# Patient Record
Sex: Female | Born: 1975 | Race: White | Hispanic: No | Marital: Married | Smoking: Former smoker
Health system: Southern US, Community
[De-identification: ages and names within clinical notes are randomized; demographics above are authoritative.]

## PROBLEM LIST (undated history)

## (undated) DIAGNOSIS — K589 Irritable bowel syndrome without diarrhea: Secondary | ICD-10-CM

## (undated) DIAGNOSIS — F329 Major depressive disorder, single episode, unspecified: Secondary | ICD-10-CM

## (undated) DIAGNOSIS — T8859XA Other complications of anesthesia, initial encounter: Secondary | ICD-10-CM

## (undated) DIAGNOSIS — T4145XA Adverse effect of unspecified anesthetic, initial encounter: Secondary | ICD-10-CM

## (undated) DIAGNOSIS — N189 Chronic kidney disease, unspecified: Secondary | ICD-10-CM

## (undated) DIAGNOSIS — F32A Depression, unspecified: Secondary | ICD-10-CM

## (undated) DIAGNOSIS — K219 Gastro-esophageal reflux disease without esophagitis: Secondary | ICD-10-CM

## (undated) HISTORY — PX: CHOLECYSTECTOMY: SHX55

---

## 1998-07-13 ENCOUNTER — Other Ambulatory Visit: Admission: RE | Admit: 1998-07-13 | Discharge: 1998-07-13 | Payer: Self-pay | Admitting: *Deleted

## 1998-11-05 ENCOUNTER — Inpatient Hospital Stay (HOSPITAL_COMMUNITY): Admission: AD | Admit: 1998-11-05 | Discharge: 1998-11-05 | Payer: Self-pay | Admitting: Obstetrics & Gynecology

## 1998-11-30 ENCOUNTER — Inpatient Hospital Stay (HOSPITAL_COMMUNITY): Admission: AD | Admit: 1998-11-30 | Discharge: 1998-12-03 | Payer: Self-pay | Admitting: *Deleted

## 1999-03-06 ENCOUNTER — Other Ambulatory Visit: Admission: RE | Admit: 1999-03-06 | Discharge: 1999-03-06 | Payer: Self-pay | Admitting: *Deleted

## 2001-04-07 ENCOUNTER — Other Ambulatory Visit: Admission: RE | Admit: 2001-04-07 | Discharge: 2001-04-07 | Payer: Self-pay | Admitting: *Deleted

## 2002-06-04 ENCOUNTER — Other Ambulatory Visit: Admission: RE | Admit: 2002-06-04 | Discharge: 2002-06-04 | Payer: Self-pay | Admitting: *Deleted

## 2004-04-25 ENCOUNTER — Other Ambulatory Visit: Admission: RE | Admit: 2004-04-25 | Discharge: 2004-04-25 | Payer: Self-pay | Admitting: Obstetrics and Gynecology

## 2005-03-21 ENCOUNTER — Other Ambulatory Visit: Admission: RE | Admit: 2005-03-21 | Discharge: 2005-03-21 | Payer: Self-pay | Admitting: Obstetrics and Gynecology

## 2005-05-20 ENCOUNTER — Inpatient Hospital Stay (HOSPITAL_COMMUNITY): Admission: AD | Admit: 2005-05-20 | Discharge: 2005-05-20 | Payer: Self-pay | Admitting: Obstetrics and Gynecology

## 2005-06-17 ENCOUNTER — Inpatient Hospital Stay (HOSPITAL_COMMUNITY): Admission: AD | Admit: 2005-06-17 | Discharge: 2005-06-20 | Payer: Self-pay | Admitting: Obstetrics and Gynecology

## 2005-09-13 ENCOUNTER — Inpatient Hospital Stay (HOSPITAL_COMMUNITY): Admission: AD | Admit: 2005-09-13 | Discharge: 2005-09-13 | Payer: Self-pay | Admitting: Obstetrics and Gynecology

## 2005-10-05 ENCOUNTER — Inpatient Hospital Stay (HOSPITAL_COMMUNITY): Admission: AD | Admit: 2005-10-05 | Discharge: 2005-10-08 | Payer: Self-pay | Admitting: Obstetrics and Gynecology

## 2005-10-09 ENCOUNTER — Encounter: Admission: RE | Admit: 2005-10-09 | Discharge: 2005-11-08 | Payer: Self-pay | Admitting: Obstetrics and Gynecology

## 2008-02-27 ENCOUNTER — Ambulatory Visit: Payer: Self-pay | Admitting: Urology

## 2008-03-03 ENCOUNTER — Ambulatory Visit: Payer: Self-pay | Admitting: Urology

## 2010-10-02 ENCOUNTER — Other Ambulatory Visit: Payer: Self-pay

## 2010-11-19 ENCOUNTER — Ambulatory Visit: Payer: Self-pay | Admitting: Family Medicine

## 2011-01-22 LAB — GC/CHLAMYDIA PROBE AMP, GENITAL: Chlamydia: NEGATIVE

## 2011-01-22 LAB — ANTIBODY SCREEN: Antibody Screen: NEGATIVE

## 2011-01-22 LAB — ABO/RH: RH Type: POSITIVE

## 2011-01-22 LAB — RUBELLA ANTIBODY, IGM: Rubella: IMMUNE

## 2011-02-13 ENCOUNTER — Other Ambulatory Visit: Payer: Self-pay

## 2011-02-23 ENCOUNTER — Other Ambulatory Visit (HOSPITAL_COMMUNITY): Payer: Self-pay | Admitting: Obstetrics and Gynecology

## 2011-02-23 DIAGNOSIS — Z3689 Encounter for other specified antenatal screening: Secondary | ICD-10-CM

## 2011-02-23 DIAGNOSIS — Z20828 Contact with and (suspected) exposure to other viral communicable diseases: Secondary | ICD-10-CM

## 2011-03-20 ENCOUNTER — Ambulatory Visit (HOSPITAL_COMMUNITY)
Admission: RE | Admit: 2011-03-20 | Discharge: 2011-03-20 | Disposition: A | Discharge status: Home / Self Care | Payer: 59 | Source: Ambulatory Visit | Attending: Obstetrics and Gynecology | Admitting: Obstetrics and Gynecology

## 2011-03-20 ENCOUNTER — Encounter (HOSPITAL_COMMUNITY): Payer: Self-pay

## 2011-03-20 DIAGNOSIS — O09529 Supervision of elderly multigravida, unspecified trimester: Secondary | ICD-10-CM | POA: Insufficient documentation

## 2011-03-20 DIAGNOSIS — Z3689 Encounter for other specified antenatal screening: Secondary | ICD-10-CM

## 2011-03-20 DIAGNOSIS — O34219 Maternal care for unspecified type scar from previous cesarean delivery: Secondary | ICD-10-CM | POA: Insufficient documentation

## 2011-03-20 DIAGNOSIS — O358XX0 Maternal care for other (suspected) fetal abnormality and damage, not applicable or unspecified: Secondary | ICD-10-CM | POA: Insufficient documentation

## 2011-03-20 DIAGNOSIS — Z1389 Encounter for screening for other disorder: Secondary | ICD-10-CM | POA: Insufficient documentation

## 2011-03-20 DIAGNOSIS — Z363 Encounter for antenatal screening for malformations: Secondary | ICD-10-CM | POA: Insufficient documentation

## 2011-03-20 DIAGNOSIS — Z20828 Contact with and (suspected) exposure to other viral communicable diseases: Secondary | ICD-10-CM

## 2011-03-20 NOTE — Progress Notes (Signed)
MATERNAL FETAL MEDICINE CONSULT  Patient Name: Shawna Snow Medical Record Number:  [id number] Date of Birth: 12/29/1975 Requesting Physician Name:  Roselle Locus II Date of Service: 03/20/2011  Chief Complaint Parvovirus exposure  History of Present Illness Shawna Snow was seen today for prenatal diagnosis secondary to parvovirus ex[posure at the request of Dr. Henderson Cloud.  The patient is a 35 y.o. [id number], with an EDD of 08/26/2011, by Last Menstrual Period dating method.  Her two sons were sequentially exposed shortly after her LMP.  She had myalgia/arthralgia symptoms during the same time. She had a positive parvovirus IgM on 8/16, at would have been about 5-[redacted] weeks gestation. She is now about 11-13 weeks from exposure.    Review of Systems A comprehensive review of systems was negative.  Patient History OB History    Grav Para Term Preterm Abortions TAB SAB Ect Mult Living   3 2 2  0 0 0 0 0 0 2     # Outc Date GA Lbr Len/2nd Wgt Sex Del Anes PTL Lv   1 TRM            2 TRM            3 CUR               No past medical history on file.  No past surgical history on file.  History   Social History  . Marital Status: Married    Spouse Name: N/A    Number of Children: N/A  . Years of Education: N/A   Social History Main Topics  . Smoking status: Not on file  . Smokeless tobacco: Not on file  . Alcohol Use: Not on file  . Drug Use: Not on file  . Sexually Active: Not on file   Other Topics Concern  . Not on file   Social History Narrative  . No narrative on file    No family history on file. In addition, the patient has no family history of mental retardation, birth defects, or genetic diseases.  Physical Examination Filed Vitals:   03/20/11 1405  BP: 113/66  Pulse: 83   A fetal ultrasound study today showed no evidence for fetal congestive heart failure at [redacted] weeks gestation.  The fetal anatomic survey was remarkable for a suggestion of extraaxial  polydactyly.   Assessment and Recommendations 1. Parvovirus exposure Follow up study in one week with middle cerebral artery Doppler studies over next 3 weeks to assess for fetal anemia and/or congestive heart failure. It may be that the patient is past critical fetal exposure time with an 80% probaility that the fetus was not severely affected.  She was counseled that parvovirus is not associated with known fetal anatomic abnormalities.  2. Polydactyly Follow-up assessment of fetal hand and feet, as well further anatomic assessment for other potential abnormalities not seen as well at 17 weeks.  So far, there is no evidence of encephalocele, renal abnormalities, small chest, microcephaly, or craniosynostosis.  The possibility of isolated polydactyly was also mentioned.   I spent 25 minutes in face to face consultation with the patient. Thank you for the opportunity to work with Mrs [place].  Ameliyah Sarno,JOE

## 2011-03-22 ENCOUNTER — Other Ambulatory Visit (HOSPITAL_COMMUNITY): Payer: Self-pay | Admitting: Obstetrics and Gynecology

## 2011-03-22 DIAGNOSIS — Z20828 Contact with and (suspected) exposure to other viral communicable diseases: Secondary | ICD-10-CM

## 2011-03-27 ENCOUNTER — Ambulatory Visit (HOSPITAL_COMMUNITY)
Admission: RE | Admit: 2011-03-27 | Discharge: 2011-03-27 | Disposition: A | Discharge status: Home / Self Care | Payer: 59 | Source: Ambulatory Visit | Attending: Obstetrics and Gynecology | Admitting: Obstetrics and Gynecology

## 2011-03-27 DIAGNOSIS — Z20828 Contact with and (suspected) exposure to other viral communicable diseases: Secondary | ICD-10-CM

## 2011-03-27 DIAGNOSIS — O34219 Maternal care for unspecified type scar from previous cesarean delivery: Secondary | ICD-10-CM | POA: Insufficient documentation

## 2011-03-27 DIAGNOSIS — Z3689 Encounter for other specified antenatal screening: Secondary | ICD-10-CM

## 2011-03-27 DIAGNOSIS — O09529 Supervision of elderly multigravida, unspecified trimester: Secondary | ICD-10-CM | POA: Insufficient documentation

## 2011-03-27 NOTE — Progress Notes (Signed)
Obstetric ultrasound performed today.  Please see report in [facility]. 

## 2011-03-29 NOTE — Progress Notes (Signed)
Encounter addended by: Marlana Latus, RN on: 03/29/2011  5:16 PM<BR>     Documentation filed: Episodes, Chief Complaint Section

## 2011-04-04 ENCOUNTER — Ambulatory Visit (HOSPITAL_COMMUNITY): Payer: 59

## 2011-04-10 ENCOUNTER — Ambulatory Visit (HOSPITAL_COMMUNITY)
Admission: RE | Admit: 2011-04-10 | Discharge: 2011-04-10 | Disposition: A | Discharge status: Home / Self Care | Payer: 59 | Source: Ambulatory Visit | Attending: Maternal and Fetal Medicine | Admitting: Maternal and Fetal Medicine

## 2011-04-10 DIAGNOSIS — Z20828 Contact with and (suspected) exposure to other viral communicable diseases: Secondary | ICD-10-CM

## 2011-04-10 DIAGNOSIS — O09529 Supervision of elderly multigravida, unspecified trimester: Secondary | ICD-10-CM | POA: Insufficient documentation

## 2011-04-10 DIAGNOSIS — O34219 Maternal care for unspecified type scar from previous cesarean delivery: Secondary | ICD-10-CM | POA: Insufficient documentation

## 2011-04-10 NOTE — Progress Notes (Signed)
Shawna Snow was seen for ultrasound appointment today.  Please see [facility] report for details.

## 2011-04-19 ENCOUNTER — Ambulatory Visit (HOSPITAL_COMMUNITY)
Admission: RE | Admit: 2011-04-19 | Discharge: 2011-04-19 | Disposition: A | Discharge status: Home / Self Care | Payer: 59 | Source: Ambulatory Visit | Attending: Obstetrics and Gynecology | Admitting: Obstetrics and Gynecology

## 2011-04-19 DIAGNOSIS — O34219 Maternal care for unspecified type scar from previous cesarean delivery: Secondary | ICD-10-CM | POA: Insufficient documentation

## 2011-04-19 DIAGNOSIS — Z20828 Contact with and (suspected) exposure to other viral communicable diseases: Secondary | ICD-10-CM

## 2011-04-19 DIAGNOSIS — O09529 Supervision of elderly multigravida, unspecified trimester: Secondary | ICD-10-CM | POA: Insufficient documentation

## 2011-07-17 ENCOUNTER — Ambulatory Visit: Payer: Self-pay | Admitting: Internal Medicine

## 2011-08-06 ENCOUNTER — Encounter (HOSPITAL_COMMUNITY): Payer: Self-pay | Admitting: Pharmacist

## 2011-08-14 ENCOUNTER — Encounter (HOSPITAL_COMMUNITY): Payer: Self-pay

## 2011-08-15 ENCOUNTER — Encounter (HOSPITAL_COMMUNITY): Payer: Self-pay

## 2011-08-15 ENCOUNTER — Encounter (HOSPITAL_COMMUNITY)
Admission: RE | Admit: 2011-08-15 | Discharge: 2011-08-15 | Disposition: A | Discharge status: Home / Self Care | Payer: 59 | Source: Ambulatory Visit | Attending: Obstetrics and Gynecology | Admitting: Obstetrics and Gynecology

## 2011-08-15 HISTORY — DX: Chronic kidney disease, unspecified: N18.9

## 2011-08-15 HISTORY — DX: Major depressive disorder, single episode, unspecified: F32.9

## 2011-08-15 HISTORY — DX: Adverse effect of unspecified anesthetic, initial encounter: T41.45XA

## 2011-08-15 HISTORY — DX: Gastro-esophageal reflux disease without esophagitis: K21.9

## 2011-08-15 HISTORY — DX: Irritable bowel syndrome, unspecified: K58.9

## 2011-08-15 HISTORY — DX: Depression, unspecified: F32.A

## 2011-08-15 HISTORY — DX: Other complications of anesthesia, initial encounter: T88.59XA

## 2011-08-15 LAB — CBC
MCH: 30.1 pg (ref 26.0–34.0)
MCHC: 34.1 g/dL (ref 30.0–36.0)
RBC: 3.49 MIL/uL — ABNORMAL LOW (ref 3.87–5.11)
RDW: 14 % (ref 11.5–15.5)
WBC: 10.9 10*3/uL — ABNORMAL HIGH (ref 4.0–10.5)

## 2011-08-15 LAB — SURGICAL PCR SCREEN: Staphylococcus aureus: NEGATIVE

## 2011-08-15 LAB — RPR: RPR Ser Ql: NONREACTIVE

## 2011-08-15 NOTE — Patient Instructions (Signed)
YOUR PROCEDURE IS SCHEDULED ON:08/20/11  ENTER THROUGH THE MAIN ENTRANCE OF [facility] AT:1130 am  USE DESK PHONE AND DIAL [phone] TO INFORM us OF YOUR ARRIVAL  CALL [phone] IF YOU HAVE ANY QUESTIONS OR PROBLEMS PRIOR TO YOUR ARRIVAL.  REMEMBER: DO NOT EAT AFTER MIDNIGHT : Sunday  SPECIAL INSTRUCTIONS:clear liquids ok until 9am on Monday   YOU MAY BRUSH YOUR TEETH THE MORNING OF SURGERY   TAKE THESE MEDICINES THE DAY OF SURGERY WITH SIP OF WATER:none   DO NOT WEAR JEWELRY, EYE MAKEUP, LIPSTICK OR DARK FINGERNAIL POLISH DO NOT WEAR LOTIONS  DO NOT SHAVE FOR 48 HOURS PRIOR TO SURGERY  YOU WILL NOT BE ALLOWED TO DRIVE YOURSELF HOME.  NAME OF DRIVER:Jason

## 2011-08-19 ENCOUNTER — Other Ambulatory Visit: Payer: Self-pay | Admitting: Obstetrics and Gynecology

## 2011-08-19 MED ORDER — DEXTROSE 5 % IV SOLN
INTRAVENOUS | Status: AC
  Administered 2011-08-20: 100 mL via INTRAVENOUS
  Filled 2011-08-19: qty 2.5

## 2011-08-20 ENCOUNTER — Encounter (HOSPITAL_COMMUNITY): Payer: Self-pay | Admitting: Anesthesiology

## 2011-08-20 ENCOUNTER — Inpatient Hospital Stay (HOSPITAL_COMMUNITY): Payer: 59

## 2011-08-20 ENCOUNTER — Encounter (HOSPITAL_COMMUNITY)
Admission: RE | Disposition: A | Discharge status: Home / Self Care | Payer: Self-pay | Source: Ambulatory Visit | Attending: Obstetrics and Gynecology

## 2011-08-20 ENCOUNTER — Encounter (HOSPITAL_COMMUNITY): Payer: Self-pay

## 2011-08-20 ENCOUNTER — Encounter (HOSPITAL_COMMUNITY): Payer: Self-pay | Admitting: *Deleted

## 2011-08-20 ENCOUNTER — Inpatient Hospital Stay (HOSPITAL_COMMUNITY)
Admission: RE | Admit: 2011-08-20 | Discharge: 2011-08-23 | DRG: 766 | Disposition: A | Discharge status: Home / Self Care | Payer: 59 | Source: Ambulatory Visit | Attending: Obstetrics and Gynecology | Admitting: Obstetrics and Gynecology

## 2011-08-20 DIAGNOSIS — Z01818 Encounter for other preprocedural examination: Secondary | ICD-10-CM

## 2011-08-20 DIAGNOSIS — O09529 Supervision of elderly multigravida, unspecified trimester: Secondary | ICD-10-CM | POA: Diagnosis present

## 2011-08-20 DIAGNOSIS — Z302 Encounter for sterilization: Secondary | ICD-10-CM

## 2011-08-20 DIAGNOSIS — O34219 Maternal care for unspecified type scar from previous cesarean delivery: Principal | ICD-10-CM | POA: Diagnosis present

## 2011-08-20 DIAGNOSIS — Z01812 Encounter for preprocedural laboratory examination: Secondary | ICD-10-CM

## 2011-08-20 LAB — TYPE AND SCREEN
ABO/RH(D): O POS
Antibody Screen: NEGATIVE

## 2011-08-20 LAB — ABO/RH: ABO/RH(D): O POS

## 2011-08-20 SURGERY — Surgical Case
Anesthesia: Spinal | Site: Abdomen | Wound class: Clean Contaminated

## 2011-08-20 MED ORDER — SCOPOLAMINE 1 MG/3DAYS TD PT72
MEDICATED_PATCH | TRANSDERMAL | Status: AC
  Administered 2011-08-20: 1.5 mg
  Filled 2011-08-20: qty 1

## 2011-08-20 MED ORDER — HYDROMORPHONE HCL PF 1 MG/ML IJ SOLN
0.5000 mg | INTRAMUSCULAR | Status: DC | PRN
  Administered 2011-08-20: 0.5 mg via INTRAVENOUS
  Filled 2011-08-20: qty 1

## 2011-08-20 MED ORDER — MIDAZOLAM HCL 5 MG/ML IJ SOLN
INTRAMUSCULAR | Status: DC | PRN
  Administered 2011-08-20: 0.5 mg via INTRAVENOUS

## 2011-08-20 MED ORDER — SODIUM CHLORIDE 0.9 % IV SOLN: 1.0000 ug/kg/h | INTRAVENOUS | Status: DC | PRN

## 2011-08-20 MED ORDER — HYDROMORPHONE HCL PF 1 MG/ML IJ SOLN
INTRAMUSCULAR | Status: AC
  Filled 2011-08-20: qty 1

## 2011-08-20 MED ORDER — FENTANYL CITRATE 0.05 MG/ML IJ SOLN
INTRAMUSCULAR | Status: AC
  Filled 2011-08-20: qty 2

## 2011-08-20 MED ORDER — WITCH HAZEL-GLYCERIN EX PADS: 1.0000 "application " | MEDICATED_PAD | CUTANEOUS | Status: DC | PRN

## 2011-08-20 MED ORDER — DIPHENHYDRAMINE HCL 50 MG/ML IJ SOLN: 12.5000 mg | INTRAMUSCULAR | Status: DC | PRN

## 2011-08-20 MED ORDER — LACTATED RINGERS IV SOLN
INTRAVENOUS | Status: DC
  Administered 2011-08-20: 23:00:00 via INTRAVENOUS

## 2011-08-20 MED ORDER — OXYCODONE-ACETAMINOPHEN 5-325 MG PO TABS: 1.0000 | ORAL_TABLET | ORAL | Status: DC | PRN

## 2011-08-20 MED ORDER — MENTHOL 3 MG MT LOZG: 1.0000 | LOZENGE | OROMUCOSAL | Status: DC | PRN

## 2011-08-20 MED ORDER — ONDANSETRON HCL 4 MG/2ML IJ SOLN: 4.0000 mg | Freq: Three times a day (TID) | INTRAMUSCULAR | Status: DC | PRN

## 2011-08-20 MED ORDER — DIPHENHYDRAMINE HCL 50 MG/ML IJ SOLN: 25.0000 mg | INTRAMUSCULAR | Status: DC | PRN

## 2011-08-20 MED ORDER — LANOLIN HYDROUS EX OINT: 1.0000 "application " | TOPICAL_OINTMENT | CUTANEOUS | Status: DC | PRN

## 2011-08-20 MED ORDER — OXYTOCIN 10 UNIT/ML IJ SOLN
INTRAMUSCULAR | Status: AC
  Filled 2011-08-20: qty 4

## 2011-08-20 MED ORDER — ONDANSETRON HCL 4 MG/2ML IJ SOLN: 4.0000 mg | Freq: Once | INTRAMUSCULAR | Status: DC

## 2011-08-20 MED ORDER — BUPIVACAINE IN DEXTROSE 0.75-8.25 % IT SOLN
INTRATHECAL | Status: DC | PRN
  Administered 2011-08-20: 9.75 mg via INTRATHECAL

## 2011-08-20 MED ORDER — OXYTOCIN 20 UNITS IN LACTATED RINGERS INFUSION - SIMPLE
INTRAVENOUS | Status: DC | PRN
  Administered 2011-08-20: 20 [IU] via INTRAVENOUS

## 2011-08-20 MED ORDER — PHENYLEPHRINE 40 MCG/ML (10ML) SYRINGE FOR IV PUSH (FOR BLOOD PRESSURE SUPPORT)
PREFILLED_SYRINGE | INTRAVENOUS | Status: AC
  Filled 2011-08-20: qty 5

## 2011-08-20 MED ORDER — EPHEDRINE 5 MG/ML INJ
INTRAVENOUS | Status: AC
  Filled 2011-08-20: qty 10

## 2011-08-20 MED ORDER — SODIUM CHLORIDE 0.9 % IJ SOLN: 3.0000 mL | INTRAMUSCULAR | Status: DC | PRN

## 2011-08-20 MED ORDER — KETOROLAC TROMETHAMINE 60 MG/2ML IM SOLN
60.0000 mg | Freq: Once | INTRAMUSCULAR | Status: AC | PRN
  Administered 2011-08-20: 60 mg via INTRAMUSCULAR

## 2011-08-20 MED ORDER — SIMETHICONE 80 MG PO CHEW
80.0000 mg | CHEWABLE_TABLET | Freq: Three times a day (TID) | ORAL | Status: DC
  Administered 2011-08-20 – 2011-08-22 (×9): 80 mg via ORAL

## 2011-08-20 MED ORDER — KETOROLAC TROMETHAMINE 30 MG/ML IJ SOLN: 30.0000 mg | Freq: Four times a day (QID) | INTRAMUSCULAR | Status: AC | PRN

## 2011-08-20 MED ORDER — CITRIC ACID-SODIUM CITRATE 334-500 MG/5ML PO SOLN
30.0000 mL | Freq: Once | ORAL | Status: AC
  Administered 2011-08-20: 30 mL via ORAL

## 2011-08-20 MED ORDER — MEPERIDINE HCL 25 MG/ML IJ SOLN
INTRAMUSCULAR | Status: DC | PRN
  Administered 2011-08-20 (×2): 12.5 mg via INTRAVENOUS

## 2011-08-20 MED ORDER — ONDANSETRON HCL 4 MG/2ML IJ SOLN: 4.0000 mg | INTRAMUSCULAR | Status: DC | PRN

## 2011-08-20 MED ORDER — OXYTOCIN 20 UNITS IN LACTATED RINGERS INFUSION - SIMPLE
125.0000 mL/h | INTRAVENOUS | Status: AC
  Administered 2011-08-20: 125 mL/h via INTRAVENOUS

## 2011-08-20 MED ORDER — IBUPROFEN 600 MG PO TABS: 600.0000 mg | ORAL_TABLET | Freq: Four times a day (QID) | ORAL | Status: DC | PRN

## 2011-08-20 MED ORDER — NALBUPHINE HCL 10 MG/ML IJ SOLN
5.0000 mg | INTRAMUSCULAR | Status: DC | PRN
Start: 2011-08-20 — End: 2011-08-23

## 2011-08-20 MED ORDER — DIPHENHYDRAMINE HCL 25 MG PO CAPS: 25.0000 mg | ORAL_CAPSULE | Freq: Four times a day (QID) | ORAL | Status: DC | PRN

## 2011-08-20 MED ORDER — FENTANYL CITRATE 0.05 MG/ML IJ SOLN
25.0000 ug | INTRAMUSCULAR | Status: DC | PRN
  Administered 2011-08-20: 50 ug via INTRAVENOUS

## 2011-08-20 MED ORDER — MORPHINE SULFATE 0.5 MG/ML IJ SOLN
INTRAMUSCULAR | Status: AC
  Filled 2011-08-20: qty 10

## 2011-08-20 MED ORDER — IBUPROFEN 600 MG PO TABS
600.0000 mg | ORAL_TABLET | Freq: Four times a day (QID) | ORAL | Status: DC
  Administered 2011-08-20 – 2011-08-23 (×11): 600 mg via ORAL
  Filled 2011-08-20 (×11): qty 1

## 2011-08-20 MED ORDER — SENNOSIDES-DOCUSATE SODIUM 8.6-50 MG PO TABS
2.0000 | ORAL_TABLET | Freq: Every day | ORAL | Status: DC
  Administered 2011-08-20 – 2011-08-21 (×2): 2 via ORAL

## 2011-08-20 MED ORDER — OXYTOCIN 20 UNITS IN LACTATED RINGERS INFUSION - SIMPLE
INTRAVENOUS | Status: AC
  Administered 2011-08-20: 125 mL/h via INTRAVENOUS
  Filled 2011-08-20: qty 1000

## 2011-08-20 MED ORDER — MORPHINE SULFATE (PF) 0.5 MG/ML IJ SOLN
INTRAMUSCULAR | Status: DC | PRN
  Administered 2011-08-20: .1 mg via INTRATHECAL

## 2011-08-20 MED ORDER — LACTATED RINGERS IV SOLN
INTRAVENOUS | Status: DC
  Administered 2011-08-20 (×4): via INTRAVENOUS

## 2011-08-20 MED ORDER — SIMETHICONE 80 MG PO CHEW: 80.0000 mg | CHEWABLE_TABLET | ORAL | Status: DC | PRN

## 2011-08-20 MED ORDER — MEPERIDINE HCL 25 MG/ML IJ SOLN
INTRAMUSCULAR | Status: AC
  Filled 2011-08-20: qty 1

## 2011-08-20 MED ORDER — METOCLOPRAMIDE HCL 5 MG/ML IJ SOLN: 10.0000 mg | Freq: Three times a day (TID) | INTRAMUSCULAR | Status: DC | PRN

## 2011-08-20 MED ORDER — FENTANYL CITRATE 0.05 MG/ML IJ SOLN
INTRAMUSCULAR | Status: DC | PRN
  Administered 2011-08-20: 15 ug via INTRATHECAL
  Administered 2011-08-20: 50 ug via INTRAVENOUS
  Administered 2011-08-20: 85 ug via INTRAVENOUS
  Administered 2011-08-20: 50 ug via INTRAVENOUS

## 2011-08-20 MED ORDER — ZOLPIDEM TARTRATE 5 MG PO TABS: 5.0000 mg | ORAL_TABLET | Freq: Every evening | ORAL | Status: DC | PRN

## 2011-08-20 MED ORDER — DIPHENHYDRAMINE HCL 25 MG PO CAPS: 25.0000 mg | ORAL_CAPSULE | ORAL | Status: DC | PRN

## 2011-08-20 MED ORDER — ONDANSETRON HCL 4 MG PO TABS: 4.0000 mg | ORAL_TABLET | ORAL | Status: DC | PRN

## 2011-08-20 MED ORDER — CITRIC ACID-SODIUM CITRATE 334-500 MG/5ML PO SOLN
ORAL | Status: AC
  Administered 2011-08-20: 30 mL via ORAL
  Filled 2011-08-20: qty 15

## 2011-08-20 MED ORDER — NALOXONE HCL 0.4 MG/ML IJ SOLN: 0.4000 mg | INTRAMUSCULAR | Status: DC | PRN

## 2011-08-20 MED ORDER — KETOROLAC TROMETHAMINE 60 MG/2ML IM SOLN
INTRAMUSCULAR | Status: AC
  Administered 2011-08-20: 60 mg via INTRAMUSCULAR
  Filled 2011-08-20: qty 2

## 2011-08-20 MED ORDER — TETANUS-DIPHTH-ACELL PERTUSSIS 5-2.5-18.5 LF-MCG/0.5 IM SUSP
0.5000 mL | Freq: Once | INTRAMUSCULAR | Status: AC
  Administered 2011-08-22: 0.5 mL via INTRAMUSCULAR
  Filled 2011-08-20: qty 0.5

## 2011-08-20 MED ORDER — DIBUCAINE 1 % RE OINT: 1.0000 "application " | TOPICAL_OINTMENT | RECTAL | Status: DC | PRN

## 2011-08-20 MED ORDER — LIDOCAINE HCL 1 % IJ SOLN
INTRAMUSCULAR | Status: DC | PRN
  Administered 2011-08-20: 20 mL

## 2011-08-20 MED ORDER — ONDANSETRON HCL 4 MG/2ML IJ SOLN
INTRAMUSCULAR | Status: AC
  Filled 2011-08-20: qty 2

## 2011-08-20 MED ORDER — SCOPOLAMINE 1 MG/3DAYS TD PT72: 1.0000 | MEDICATED_PATCH | Freq: Once | TRANSDERMAL | Status: DC

## 2011-08-20 MED ORDER — MIDAZOLAM HCL 2 MG/2ML IJ SOLN
INTRAMUSCULAR | Status: AC
  Filled 2011-08-20: qty 2

## 2011-08-20 MED ORDER — ONDANSETRON HCL 4 MG/2ML IJ SOLN
INTRAMUSCULAR | Status: DC | PRN
  Administered 2011-08-20: 4 mg via INTRAVENOUS

## 2011-08-20 MED ORDER — PRENATAL MULTIVITAMIN CH
1.0000 | ORAL_TABLET | Freq: Every day | ORAL | Status: DC
  Administered 2011-08-21 – 2011-08-23 (×3): 1 via ORAL
  Filled 2011-08-20 (×3): qty 1

## 2011-08-20 MED ORDER — MEPERIDINE HCL 25 MG/ML IJ SOLN: 6.2500 mg | INTRAMUSCULAR | Status: DC | PRN

## 2011-08-20 SURGICAL SUPPLY — 26 items
CLOTH BEACON ORANGE TIMEOUT ST (SAFETY) ×2 IMPLANT
CONTAINER PREFILL 10% NBF 15ML (MISCELLANEOUS) ×2 IMPLANT
DRESSING TELFA 8X3 (GAUZE/BANDAGES/DRESSINGS) ×2 IMPLANT
ELECT REM PT RETURN 9FT ADLT (ELECTROSURGICAL) ×2
ELECTRODE REM PT RTRN 9FT ADLT (ELECTROSURGICAL) ×1 IMPLANT
EXTRACTOR VACUUM M CUP 4 TUBE (SUCTIONS) ×1 IMPLANT
GAUZE SPONGE 4X4 12PLY STRL LF (GAUZE/BANDAGES/DRESSINGS) ×3 IMPLANT
GLOVE BIO SURGEON STRL SZ8 (GLOVE) ×4 IMPLANT
GOWN PREVENTION PLUS LG XLONG (DISPOSABLE) ×2 IMPLANT
KIT ABG SYR 3ML LUER SLIP (SYRINGE) ×2 IMPLANT
NDL HYPO 25X5/8 SAFETYGLIDE (NEEDLE) ×1 IMPLANT
NEEDLE HYPO 25X5/8 SAFETYGLIDE (NEEDLE) ×2 IMPLANT
NS IRRIG 1000ML POUR BTL (IV SOLUTION) ×2 IMPLANT
PACK C SECTION WH (CUSTOM PROCEDURE TRAY) ×2 IMPLANT
PAD ABD 7.5X8 STRL (GAUZE/BANDAGES/DRESSINGS) ×2 IMPLANT
SLEEVE SCD COMPRESS KNEE MED (MISCELLANEOUS) IMPLANT
STAPLER VISISTAT 35W (STAPLE) ×1 IMPLANT
STRIP CLOSURE SKIN 1/2X4 (GAUZE/BANDAGES/DRESSINGS) ×2 IMPLANT
SUT MNCRL 0 VIOLET CTX 36 (SUTURE) ×4 IMPLANT
SUT MONOCRYL 0 CTX 36 (SUTURE) ×4
SUT PDS AB 0 CTX 60 (SUTURE) ×2 IMPLANT
SUT PLAIN 0 NONE (SUTURE) IMPLANT
TAPE CLOTH SURG 4X10 WHT LF (GAUZE/BANDAGES/DRESSINGS) ×1 IMPLANT
TOWEL OR 17X24 6PK STRL BLUE (TOWEL DISPOSABLE) ×4 IMPLANT
TRAY FOLEY CATH 14FR (SET/KITS/TRAYS/PACK) ×2 IMPLANT
WATER STERILE IRR 1000ML POUR (IV SOLUTION) ×2 IMPLANT

## 2011-08-20 NOTE — H&P (Signed)
Shawna Snow is a 36 y.o. female presenting for repeat cesarean section and BTL. Pregnancy complicated by AMA with normal first trimester screen, previous C/S x 2, Parvo exposure in early pregnancy with normal U/S with MFM, hs of kidney stones, post axial dactyly. Maternal Medical History:  Fetal activity: Perceived fetal activity is normal.      OB History    Grav Para Term Preterm Abortions TAB SAB Ect Mult Living   3 2 2  0 0 0 0 0 0 2     Past Medical History  Diagnosis Date  . Chronic kidney disease     h/o stones  . IBS (irritable bowel syndrome)   . GERD (gastroesophageal reflux disease)     related to pregnancy  . Depression     h/o pp depression  . Complication of anesthesia     spinal went up to chest- pt felt like she couldn't breathe   Past Surgical History  Procedure Date  . Cesarean section     x2  . Cholecystectomy    Family History: family history is not on file. Social History:  reports that she has quit smoking. Her smoking use included Cigarettes. She does not have any smokeless tobacco history on file. She reports that she does not drink alcohol or use illicit drugs.  Review of Systems  Eyes: Negative for blurred vision.  Gastrointestinal: Negative for abdominal pain.  Neurological: Negative for headaches.      Blood pressure 132/71, pulse 95, temperature 98.1 F (36.7 C), temperature source Oral, resp. rate 16, last menstrual period 11/19/2010, SpO2 98.00%. Exam Physical Exam  Cardiovascular: Normal rate and regular rhythm.   Respiratory: Effort normal and breath sounds normal.  GI: There is no tenderness.  Neurological: She has normal reflexes.    Prenatal labs: ABO, Rh: O/Positive/-- (09/10 0000) Antibody: Negative (09/10 0000) Rubella: Immune (09/10 0000) RPR: NON REACTIVE (04/03 1128)  HBsAg: Negative (09/10 0000)  HIV: Non-reactive (09/10 0000)  GBS:     Assessment/Plan: 36 yo G3P2 at 17 1/7 weeks for repeat C/S and BTL.  D/W pt  surgery and risks-infection, bleeding/transfusion-HIV/Hep, organ damage, DVT/PE, pneumonia. D/W BTL-permanence, failure rate, increased ectopic risk.  All questions answered. Parvo exposure with normal serial U/S Post axial dactyly   Tris Howell II,Rajan Burgard E 08/20/2011, 12:21 PM

## 2011-08-20 NOTE — Anesthesia Postprocedure Evaluation (Signed)
Anesthesia Post Note  Patient: Shawna Snow  Procedure(s) Performed: Procedure(s) (LRB): CESAREAN SECTION WITH BILATERAL TUBAL LIGATION (N/A)  Anesthesia type: Spinal  Patient location: PACU  Post pain: Pain level controlled  Post assessment: Post-op Vital signs reviewed  Last Vitals:  Filed Vitals:   08/20/11 1520  BP:   Pulse:   Temp: 36.9 C  Resp:     Post vital signs: Reviewed  Level of consciousness: awake  Complications: No apparent anesthesia complications

## 2011-08-20 NOTE — Anesthesia Procedure Notes (Signed)
Spinal  Patient location during procedure: OR Start time: 08/20/2011 1:13 PM Staffing Anesthesiologist: CASSIDY, AMY Performed by: anesthesiologist  Preanesthetic Checklist Completed: patient identified, site marked, surgical consent, pre-op evaluation, timeout performed, IV checked, risks and benefits discussed and monitors and equipment checked Spinal Block Patient position: sitting Prep: site prepped and draped and DuraPrep Patient monitoring: heart rate, cardiac monitor, continuous pulse ox and blood pressure Approach: midline Location: L3-4 Injection technique: single-shot Needle Needle type: Sprotte  Needle gauge: 24 G Needle length: 9 cm Assessment Sensory level: T4 Additional Notes Clear free flow CSF on first attempt, no paresthesia.  Patient tolerated procedure well.  Jasmine December, MD

## 2011-08-20 NOTE — Progress Notes (Signed)
Pt C/O some suprapubic pressure.  Hx of kidney stones-no flank pain, no bloody urine, no ROM.  VSS Afebrile  C/S and BTL reviewed.  All questions answered.

## 2011-08-20 NOTE — Transfer of Care (Signed)
Immediate Anesthesia Transfer of Care Note  Patient: Shawna Snow  Procedure(s) Performed: Procedure(s) (LRB): CESAREAN SECTION WITH BILATERAL TUBAL LIGATION (N/A)  Patient Location: PACU  Anesthesia Type: Spinal  Level of Consciousness: awake, alert  and oriented  Airway & Oxygen Therapy: Patient Spontanous Breathing  Post-op Assessment: Report given to PACU RN and Post -op Vital signs reviewed and stable  Post vital signs: Reviewed and stable  Complications: No apparent anesthesia complications

## 2011-08-20 NOTE — Anesthesia Preprocedure Evaluation (Addendum)
Anesthesia Evaluation  Patient identified by MRN, date of birth, ID band Patient awake    Reviewed: Allergy & Precautions, H&P , NPO status , Patient's Chart, lab work & pertinent test results, reviewed documented beta blocker date and time   Airway Mallampati: II TM Distance: >3 FB Neck ROM: full    Dental  (+) Teeth Intact   Pulmonary neg pulmonary ROS, former smoker breath sounds clear to auscultation        Cardiovascular negative cardio ROS  Rhythm:regular Rate:Normal     Neuro/Psych PSYCHIATRIC DISORDERS (h/o depression) negative neurological ROS     GI/Hepatic Neg liver ROS, GERD- (with pregnancy, taking tums)  ,  Endo/Other  negative endocrine ROS  Renal/GU Kidney stones  negative genitourinary   Musculoskeletal   Abdominal   Peds  Hematology negative hematology ROS (+)   Anesthesia Other Findings Anaphylactic reaction to PCN.  H/O high spinal and is 5'0" tall - plan to reduce dose of bupivicaine.  Reproductive/Obstetrics (+) Pregnancy                          Anesthesia Physical Anesthesia Plan  ASA: II  Anesthesia Plan: Spinal   Post-op Pain Management:    Induction:   Airway Management Planned:   Additional Equipment:   Intra-op Plan:   Post-operative Plan:   Informed Consent: I have reviewed the patients History and Physical, chart, labs and discussed the procedure including the risks, benefits and alternatives for the proposed anesthesia with the patient or authorized representative who has indicated his/her understanding and acceptance.     Plan Discussed with: CRNA and Surgeon  Anesthesia Plan Comments:         Anesthesia Quick Evaluation

## 2011-08-20 NOTE — Brief Op Note (Signed)
08/20/2011  2:03 PM  PATIENT:  Shawna Snow  36 y.o. female  PRE-OPERATIVE DIAGNOSIS:  previous cesarean section; desires sterilization  POST-OPERATIVE DIAGNOSIS:  previous cesarean section; desires sterilization  PROCEDURE:  Procedure(s) (LRB): CESAREAN SECTION WITH BILATERAL TUBAL LIGATION (N/A)  SURGEON:  Surgeon(s) and Role:    * Leslie Andrea, MD - Primary  PHYSICIAN ASSISTANT:   ASSISTANTS: none   ANESTHESIA:   spinal  EBL:  Total I/O In: 1900 [I.V.:1900] Out: 1100 [Urine:400; Blood:700]  BLOOD ADMINISTERED:none  DRAINS: Urinary Catheter (Foley)   LOCAL MEDICATIONS USED:  LIDOCAINE   SPECIMEN:  Source of Specimen:  placenta, bilateral fallopian tube segments  DISPOSITION OF SPECIMEN:  PATHOLOGY  COUNTS:  YES  TOURNIQUET:  * No tourniquets in log *  DICTATION: .Other Dictation: Dictation Number [id number]  PLAN OF CARE: Admit to inpatient   PATIENT DISPOSITION:  PACU - hemodynamically stable.   Delay start of Pharmacological VTE agent (>24hrs) due to surgical blood loss or risk of bleeding: not applicable

## 2011-08-20 NOTE — Addendum Note (Signed)
Addendum  created 08/20/11 1729 by Dana Allan, MD   Modules edited:Orders

## 2011-08-21 LAB — CBC
HCT: 24.2 % — ABNORMAL LOW (ref 36.0–46.0)
Hemoglobin: 8.2 g/dL — ABNORMAL LOW (ref 12.0–15.0)
MCH: 30.4 pg (ref 26.0–34.0)
MCHC: 33.9 g/dL (ref 30.0–36.0)
RBC: 2.7 MIL/uL — ABNORMAL LOW (ref 3.87–5.11)

## 2011-08-21 MED ORDER — HYDROCODONE-ACETAMINOPHEN 5-325 MG PO TABS
1.0000 | ORAL_TABLET | ORAL | Status: DC | PRN
  Administered 2011-08-21 – 2011-08-23 (×11): 1 via ORAL
  Filled 2011-08-21 (×11): qty 1

## 2011-08-21 NOTE — Anesthesia Postprocedure Evaluation (Signed)
  Anesthesia Post-op Note  Patient: Shawna Snow  Procedure(s) Performed: Procedure(s) (LRB): CESAREAN SECTION WITH BILATERAL TUBAL LIGATION (N/A)  Patient Location: PACU and Mother/Baby  Anesthesia Type: Spinal  Level of Consciousness: awake, alert , oriented and patient cooperative  Airway and Oxygen Therapy: Patient Spontanous Breathing  Post-op Pain: mild  Post-op Assessment: Patient's Cardiovascular Status Stable, Respiratory Function Stable, No signs of Nausea or vomiting and Adequate PO intake  Post-op Vital Signs: stable  Complications: No apparent anesthesia complications

## 2011-08-21 NOTE — Addendum Note (Signed)
Addendum  created 08/21/11 0846 by Elbert Ewings, CRNA   Modules edited:Notes Section

## 2011-08-21 NOTE — Op Note (Signed)
NAMEKATLYNN, NASER                 ACCOUNT NO.:  [id number]  MEDICAL RECORD NO.:  [id number]  LOCATION:  9130                          FACILITY:  [facility]  PHYSICIAN:  Guy Sandifer. Henderson Cloud, M.D. DATE OF BIRTH:  02/26/1976  DATE OF PROCEDURE:  08/20/2011 DATE OF DISCHARGE:                              OPERATIVE REPORT   PREOPERATIVE DIAGNOSES: 1. Previous cesarean section x2, desires repeat. 2. Desires permanent sterilization.  POSTOPERATIVE DIAGNOSES: 1. Previous cesarean section x2, desires repeat. 2. Desires permanent sterilization.  PROCEDURES: 1. Repeat low-transverse cesarean section. 2. Bilateral tubal ligation.  SURGEON:  Guy Sandifer. Henderson Cloud, MD  ANESTHESIA:  Spinal, Dr. Dana Allan.  SPECIMENS: 1. Placenta. 2. Bilateral tubal segments to pathology.  ESTIMATED BLOOD LOSS:  750 mL.  INDICATIONS AND CONSENT:  The patient is a 36 year old married white female, G3, P2 at 39-1/7th weeks with two previous cesarean sections. She desires repeat.  She also desires permanent sterilization. Potential risks and complications have been discussed with the patient preoperatively including, but not limited to infection, organ damage, bleeding requiring transfusion of blood products with HIV and hepatitis acquisition, DVT, PE, and pneumonia.  Tubal ligation, alternate methods of contraception, permanence of the procedure, failure rate, and increased ectopic risk have also been reviewed.  All questions have been answered and consent is signed on the chart.  PROCEDURE:  The patient was taken to the operating room where spinal anesthetic was placed per Dr. Rodman Pickle.  Time-out was undertaken.  She was then prepped, Foley catheter was placed and the bladder was drained and she was draped in a sterile fashion.  Upon testing for adequate spinal anesthesia, the patient can feel some pain with the pinch test. Therefore, approximately 10 mL of 1% Xylocaine was infiltrated beneath the previous  Pfannenstiel scar.  Incision was then made and dissection was carried out in layers with the peritoneum.  Peritoneum was incised. The patient is feeling some pressure at that point.  The remaining 10 mL of Xylocaine were then instilled into the peritoneal cavity.  The peritoneal incision was then extended superiorly and inferiorly. Vesicouterine peritoneum was taken down cephalad laterally.  The bladder flap was developed and the bladder blade was placed.  Uterus was incised in a low-transverse manner, and the uterine cavity was entered bluntly with a hemostat.  The uterine incision was extended cephalad laterally with fingers.  Artificial rupture of membranes for clear fluid was carried out.  Vertex was elevated through the incision with the aid of a vacuum extractor.  This was elevated without difficulty and no pop-offs were noted.  Remainder of the baby was delivered.  Good cry and tone was noted.  Cord was clamped and cut.  The baby was handed to the awaiting pediatrics team.  Viable female infant, Apgars of 9 and 9 with a cord pH of 7.32 is noted.  Birth weight is pending.  Placenta was then manually delivered and sent to pathology.  Uterus was closed in two running locking imbricating layers of 0 Monocryl suture, which achieves good hemostasis.  Left fallopian tube was then identified from cornu to fimbria.  It is grasping in its mid ampullary portion with a Tanja Port  clamp.  The knuckle of tube was doubly ligated with two free ties of 2-0 plain suture.  The intervening knuckle was then sharply resected. Cautery was used to obtain complete hemostasis.  Similar procedure was carried out on the right side.  Anterior peritoneum was then closed in a running fashion with 0 Monocryl suture, which was also used to reapproximate the pyramidalis muscle in midline.  Anterior rectus fascia was closed in a running fashion with a 0 looped PDS suture and the skin was closed with clips.  All sponge,  instrument, and needle counts were correct, and the patient was transferred to the recovery room in stable condition.     Guy Sandifer Henderson Cloud, M.D.     JET/MEDQ  D:  08/20/2011  T:  08/21/2011  Job:  [id number]

## 2011-08-21 NOTE — Progress Notes (Signed)
Clinical Social Work Department  BRIEF PSYCHOSOCIAL ASSESSMENT  08/21/2011  Patient: Shawna Snow,Shawna Snow Account Number: [id number] Admit date: 08/20/2011  Clinical Social Worker: Treon Kehl, LCSWA Date/Time: 08/21/2011 10:30 AM  Referred by: Physician Date Referred: 08/21/2011   Referred for   Behavioral Health Issues   Other Referral:   History of PP depression   Interview type: Patient  Other interview type:  PSYCHOSOCIAL DATA  Living Status: HUSBAND  Admitted from facility:  Level of care:  Primary support name: Jason Guillermo  Primary support relationship to patient: SPOUSE  Degree of support available:   Involved   CURRENT CONCERNS   Current Concerns   Behavioral Health Issues   Other Concerns:  SOCIAL WORK ASSESSMENT / PLAN   Pt experienced PP depression symptoms after the birth of her son in 2000. Pt told Sw that she in and out of the hospital for 4 months which prevented her from bonding with her child. She was prescribed an antidepressant of which she took until symptoms resolved. She denies any depression symptom since then. No SI/HI history. Pt has a good support system and appears to be doing well now. She agrees to contact her physician if PP depression symptoms arise. Spouse at bedside and appropriate. Sw available to assist further if needed.   Assessment/plan status: No Further Intervention Required  Other assessment/ plan:  Information/referral to community resources:  PATIENT'S/FAMILY'S RESPONSE TO PLAN OF CARE:   Pt and spouse thanked Sw for consult.       

## 2011-08-21 NOTE — Progress Notes (Signed)
Subjective: Postpartum Day 1: Cesarean Delivery Patient reports incisional pain, tolerating PO, + flatus and no problems voiding.  Baby with polydactyly  Objective: Vital signs in last 24 hours: Temp:  [97.3 F (36.3 C)-98.5 F (36.9 C)] 97.8 F (36.6 C) (04/09 0545) Pulse Rate:  [61-95] 64  (04/09 0545) Resp:  [12-20] 16  (04/09 0545) BP: (93-132)/(49-71) 94/52 mmHg (04/09 0545) SpO2:  [95 %-98 %] 96 % (04/09 0545) Weight:  [79.379 kg (175 lb)] 79.379 kg (175 lb) (04/08 1540)  Physical Exam:  General: alert and cooperative Lochia: appropriate Uterine Fundus: firm Incision: abd dressing CDI DVT Evaluation: No evidence of DVT seen on physical exam.   Basename 08/21/11 0530  HGB 8.2*  HCT 24.2*    Assessment/Plan: Status post Cesarean section. Doing well postoperatively.  Continue current care Change pain meds to Vicodin.  Colisha Redler G 08/21/2011, 8:36 AM

## 2011-08-22 LAB — CBC
HCT: 26.6 % — ABNORMAL LOW (ref 36.0–46.0)
MCH: 30.1 pg (ref 26.0–34.0)
MCV: 91.1 fL (ref 78.0–100.0)
Platelets: 191 10*3/uL (ref 150–400)
RBC: 2.92 MIL/uL — ABNORMAL LOW (ref 3.87–5.11)
WBC: 8.4 10*3/uL (ref 4.0–10.5)

## 2011-08-22 NOTE — Progress Notes (Signed)
Subjective: Postpartum Day 2: Cesarean Delivery Patient reports incisional pain, tolerating PO, + flatus and no problems voiding.    Objective: Vital signs in last 24 hours: Temp:  [98 F (36.7 C)-98.4 F (36.9 C)] 98.4 F (36.9 C) (04/10 0530) Pulse Rate:  [65-110] 68  (04/10 0530) Resp:  [16-20] 18  (04/10 0530) BP: (104-136)/(62-71) 104/64 mmHg (04/10 0530) SpO2:  [96 %-99 %] 96 % (04/09 2130)  Physical Exam:  General: alert and cooperative Lochia: appropriate Uterine Fundus: firm Incision: healing well, staples intact DVT Evaluation: No evidence of DVT seen on physical exam.   Basename 08/21/11 0530  HGB 8.2*  HCT 24.2*    Assessment/Plan: Status post Cesarean section. Doing well postoperatively.  Continue current care.  Shanicka Oldenkamp G 08/22/2011, 7:58 AM

## 2011-08-23 MED ORDER — IBUPROFEN 600 MG PO TABS: 600.0000 mg | ORAL_TABLET | Freq: Four times a day (QID) | ORAL | Status: AC

## 2011-08-23 MED ORDER — HYDROCODONE-ACETAMINOPHEN 5-325 MG PO TABS: 1.0000 | ORAL_TABLET | ORAL | Status: AC | PRN

## 2011-08-23 NOTE — Progress Notes (Signed)
Subjective: Postpartum Day 3: Cesarean Delivery Patient reports tolerating PO, + flatus and no problems voiding.    Objective: Vital signs in last 24 hours: Temp:  [98.4 F (36.9 C)-98.7 F (37.1 C)] 98.4 F (36.9 C) (04/11 0530) Pulse Rate:  [60-80] 60  (04/11 0530) Resp:  [17-18] 18  (04/11 0530) BP: (109-123)/(71-73) 109/73 mmHg (04/11 0530) SpO2:  [98 %] 98 % (04/10 2106)  Physical Exam:  General: alert and cooperative Lochia: appropriate Uterine Fundus: firm Incision: healing well, staples left in place, patient had reported small bleeding from R margin of the incision. Incision was cleaned and steristrips applied. No evidence of active bleeding noted this am DVT Evaluation: No evidence of DVT seen on physical exam.   Basename 08/22/11 0830 08/21/11 0530  HGB 8.8* 8.2*  HCT 26.6* 24.2*    Assessment/Plan: Status post Cesarean section. Doing well postoperatively.  Discharge home with standard precautions and return to clinic in 2-3 days for staple removal.  Shawna Snow 08/23/2011, 8:28 AM

## 2011-08-23 NOTE — Plan of Care (Signed)
Problem: Discharge Progression Outcomes Goal: Remove staples per MD order Outcome: Not Applicable Date Met:  08/23/11 Patient to go to office this Monday for staple removal

## 2011-08-23 NOTE — Discharge Summary (Signed)
Obstetric Discharge Summary Reason for Admission: cesarean section Prenatal Procedures: ultrasound Intrapartum Procedures: cesarean: low cervical, transverse Postpartum Procedures: none Complications-Operative and Postpartum: none Hemoglobin  Date Value Range Status  08/22/2011 8.8* 12.0-15.0 (g/dL) Final     HCT  Date Value Range Status  08/22/2011 26.6* 36.0-46.0 (%) Final    Physical Exam:  General: alert and cooperative Lochia: appropriate Uterine Fundus: firm Incision: healing well, staples intact, steristrips applied DVT Evaluation: No evidence of DVT seen on physical exam.  Discharge Diagnoses: Term Pregnancy-delivered  Discharge Information: Date: 08/23/2011 Activity: pelvic rest Diet: routine Medications: PNV, Ibuprofen and Vicodin Condition: stable Instructions: refer to practice specific booklet Discharge to: home   Newborn Data: Live born female  Birth Weight: 8 lb 3 oz (3715 g) APGAR: 9, 9  Home with mother.  Shawna Snow G 08/23/2011, 8:45 AM

## 2011-08-26 ENCOUNTER — Inpatient Hospital Stay (HOSPITAL_COMMUNITY): Admission: RE | Admit: 2011-08-26 | Payer: Self-pay | Source: Ambulatory Visit | Admitting: Obstetrics and Gynecology

## 2012-05-22 IMAGING — US US OB LIMITED
1 series · 14 of 28 positions shown · non-contrast
Comparison: none

[Series 1: us ob limited · 0.19mm/px · 14 of 30 slices shown]
[im 2/30]
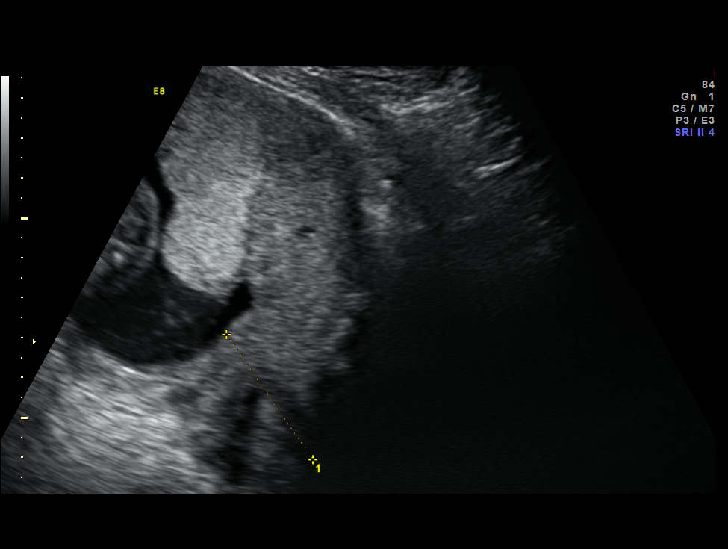
[im 4/30]
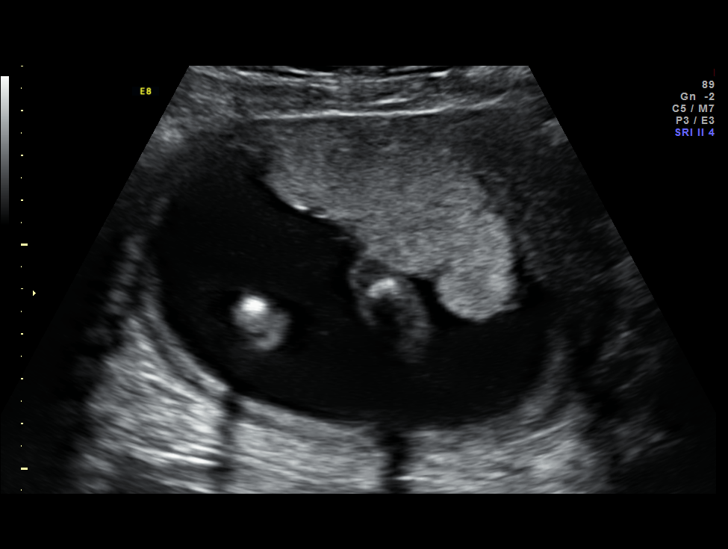
[im 6/30]
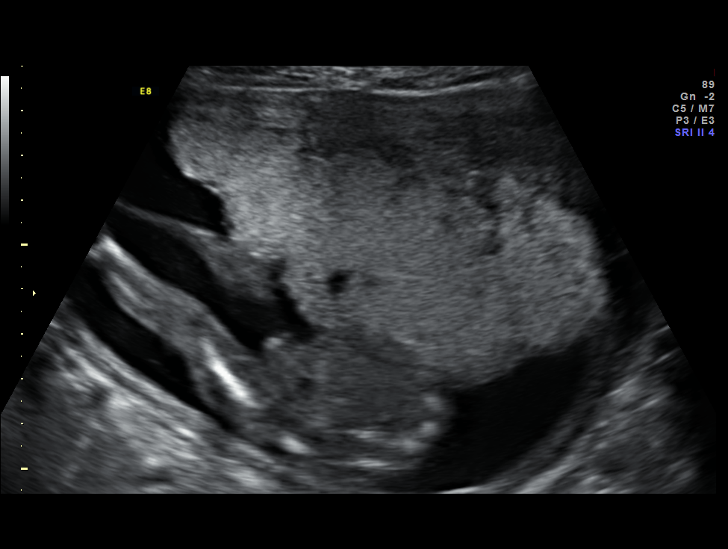
[im 8/30]
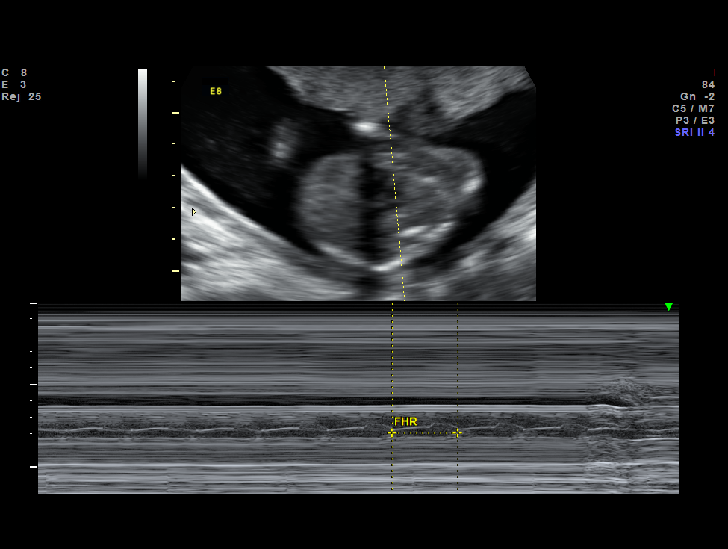
[im 10/30]
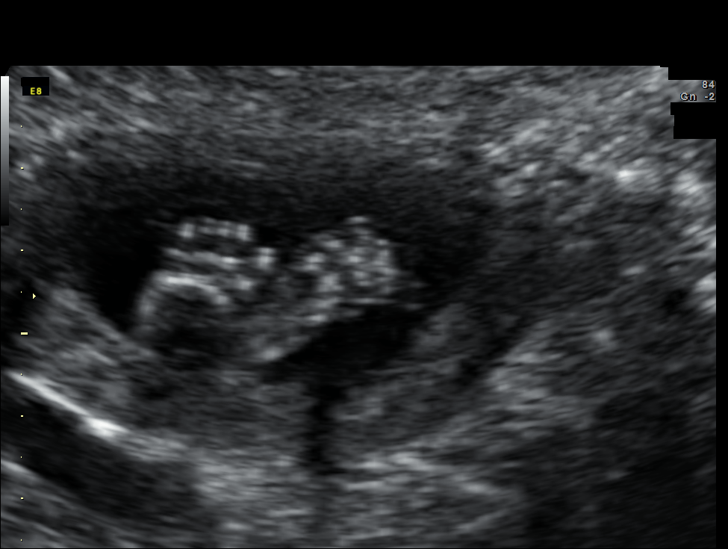
[im 12/30]
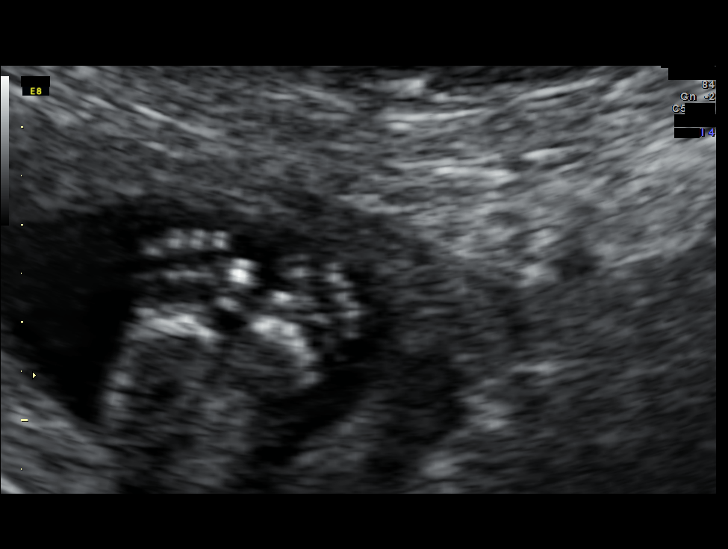
[im 14/30]
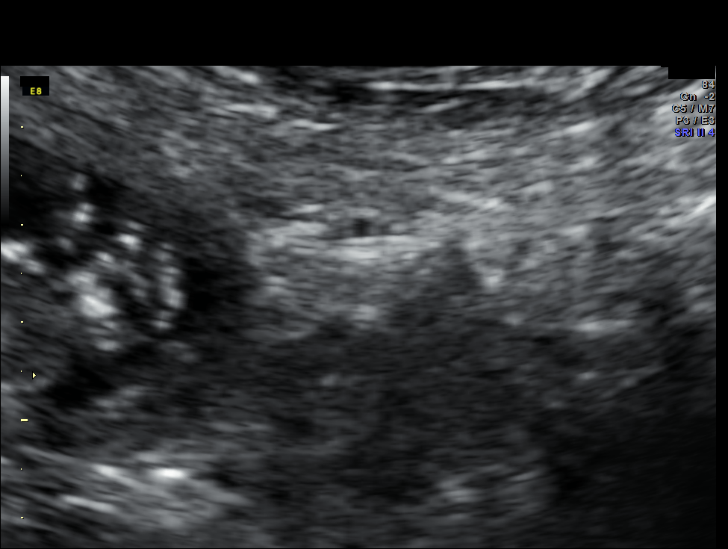
[im 17/30]
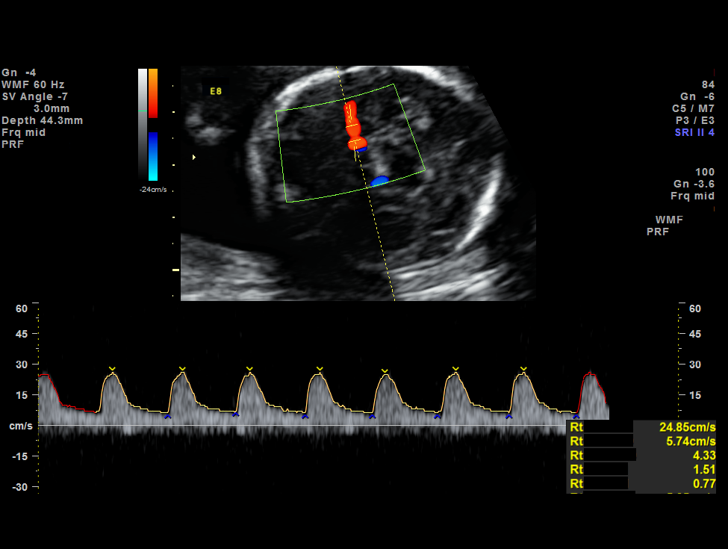
[im 19/30]
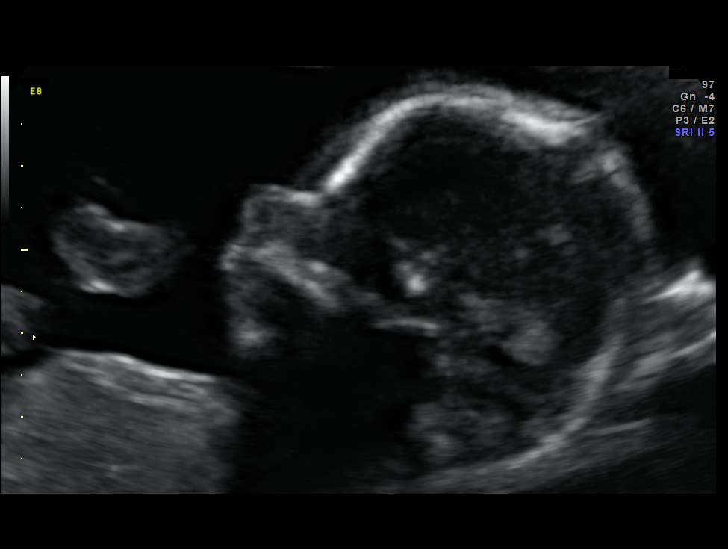
[im 21/30]
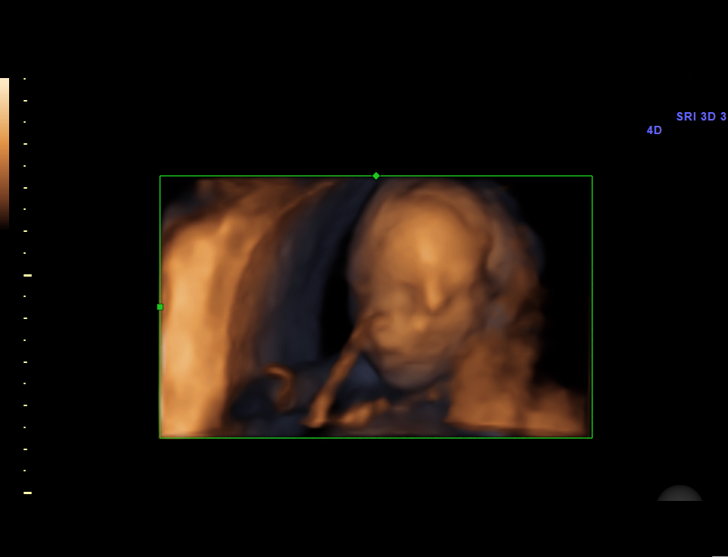
[im 23/30]
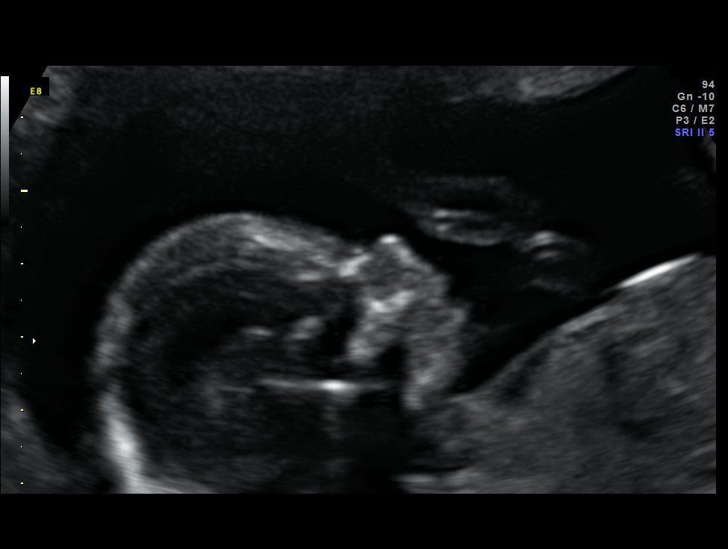
[im 25/30]
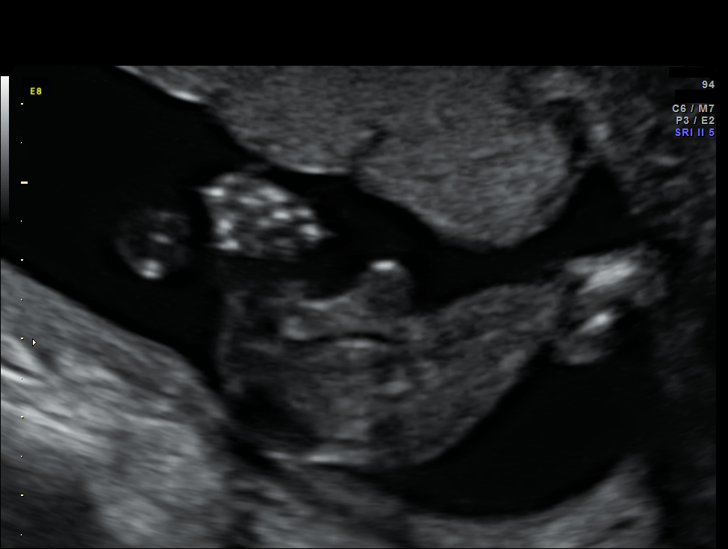
[im 27/30]
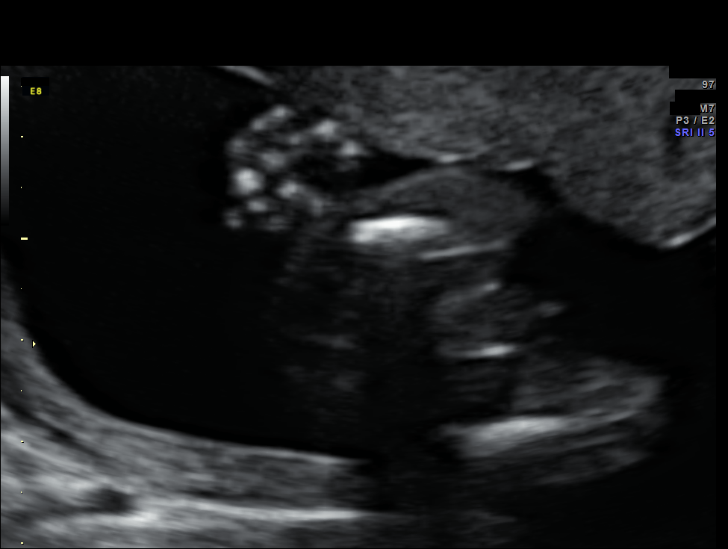
[im 30/30]
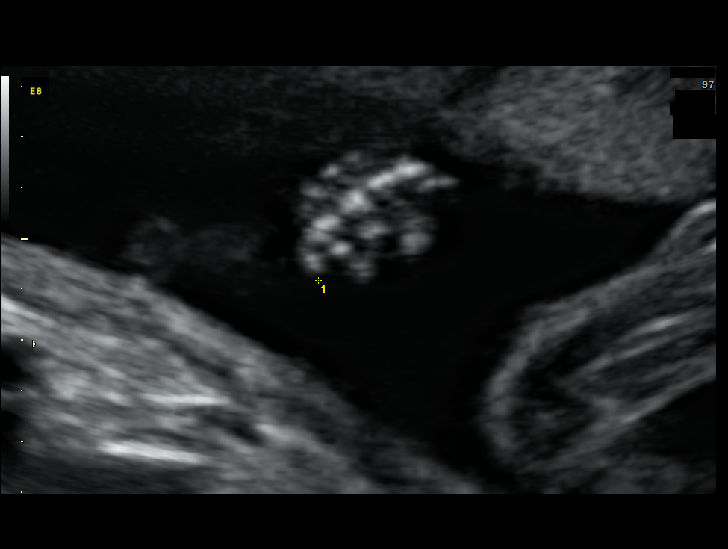

[14 of 28 positions shown; findings below may reference images not displayed]

Canned report from images found in remote index.

Refer to host system for actual result text.

## 2014-03-15 ENCOUNTER — Encounter (HOSPITAL_COMMUNITY): Payer: Self-pay | Admitting: *Deleted

## 2017-07-27 ENCOUNTER — Ambulatory Visit
Admission: EM | Admit: 2017-07-27 | Discharge: 2017-07-27 | Disposition: A | Discharge status: Home / Self Care | Payer: 59 | Attending: Family Medicine | Admitting: Family Medicine

## 2017-07-27 ENCOUNTER — Encounter: Payer: Self-pay | Admitting: Gynecology

## 2017-07-27 ENCOUNTER — Other Ambulatory Visit: Payer: Self-pay

## 2017-07-27 DIAGNOSIS — J019 Acute sinusitis, unspecified: Secondary | ICD-10-CM | POA: Diagnosis not present

## 2017-07-27 MED ORDER — DOXYCYCLINE HYCLATE 100 MG PO CAPS: 100.0000 mg | ORAL_CAPSULE | Freq: Two times a day (BID) | ORAL | 0 refills | Status: AC

## 2017-07-27 NOTE — ED Triage Notes (Signed)
Patient c/o left ear pain and sore throat x 3-4 weeks.

## 2017-07-27 NOTE — ED Provider Notes (Signed)
[facility]    CSN: [id number] Arrival date & time: 07/27/17  1150  History   Chief Complaint Chief Complaint  Patient presents with  . Otalgia  . Sore Throat   HPI  42 year old female presents with the above complaints.  Patient states that she is been sick for the past few weeks.  She states that she has had sinus pressure, runny nose, postnasal drip, congestion.  She has been battling her symptoms.  Over the past 24 hours she has had worsening sore throat and is also had left ear pain.  She is concerned she may have a left ear infection.  She has had no fever.  No known exacerbating or relieving factors.  Her pain is currently 7/10 in severity.  No other associated symptoms.  No other complaints at this time  Past Medical History:  Diagnosis Date  . Chronic kidney disease    h/o stones  . Complication of anesthesia    spinal went up to chest- pt felt like she couldn't breathe  . Depression    h/o pp depression  . GERD (gastroesophageal reflux disease)    related to pregnancy  . IBS (irritable bowel syndrome)    Past Surgical History:  Procedure Laterality Date  . CESAREAN SECTION     x2  . CHOLECYSTECTOMY     OB History    Gravida Para Term Preterm AB Living   3 3 3  0 0 3   SAB TAB Ectopic Multiple Live Births   0 0 0 0 1       Home Medications    Prior to Admission medications   Medication Sig Start Date End Date Taking? Authorizing Provider  phentermine 30 MG capsule Take 30 mg by mouth every morning.   Yes [provider]  doxycycline (VIBRAMYCIN) 100 MG capsule Take 1 capsule (100 mg total) by mouth 2 (two) times daily. 07/27/17   Tommie Sams, DO    Family History Family History  Problem Relation Age of Onset  . Heart failure Father   . Hypertension Father     Social History Social History   Tobacco Use  . Smoking status: Former Smoker    Types: Cigarettes  . Smokeless tobacco: Never Used  Substance Use Topics  .  Alcohol use: No  . Drug use: No     Allergies   Penicillins; Percocet [oxycodone-acetaminophen]; and Sulfa antibiotics   Review of Systems Review of Systems  Constitutional: Negative for fever.  HENT: Positive for congestion, ear pain, sinus pressure and sore throat.    Physical Exam Triage Vital Signs ED Triage Vitals  Enc Vitals Group     BP 07/27/17 1203 134/87     Pulse Rate 07/27/17 1203 98     Resp 07/27/17 1203 16     Temp 07/27/17 1203 98.9 F (37.2 C)     Temp Source 07/27/17 1203 Oral     SpO2 07/27/17 1203 99 %     Weight 07/27/17 1205 140 lb (63.5 kg)     Height 07/27/17 1205 5' (1.524 m)     Head Circumference --      Peak Flow --      Pain Score 07/27/17 1204 7     Pain Loc --      Pain Edu? --      Excl. in GC? --    Updated Vital Signs BP 134/87 (BP Location: Left Arm)   Pulse 98   Temp 98.9 F (37.2  C) (Oral)   Resp 16   Ht 5' (1.524 m)   Wt 140 lb (63.5 kg)   LMP 07/05/2017   SpO2 99%   BMI 27.34 kg/m   Physical Exam  Constitutional: She is oriented to person, place, and time. She appears well-developed. No distress.  HENT:  Right Ear: Tympanic membrane normal.  Left Ear: Tympanic membrane normal.  Mouth/Throat: Oropharynx is clear and moist.  Eyes: Conjunctivae are normal. Right eye exhibits no discharge. Left eye exhibits no discharge.  Neck: Neck supple.  Cardiovascular: Normal rate and regular rhythm.  Pulmonary/Chest: Effort normal and breath sounds normal. She has no wheezes. She has no rales.  Lymphadenopathy:    She has no cervical adenopathy.  Neurological: She is alert and oriented to person, place, and time.  Psychiatric: She has a normal mood and affect. Her behavior is normal.  Nursing note and vitals reviewed.  UC Treatments / Results  Labs (all labs ordered are listed, but only abnormal results are displayed) Labs Reviewed - No data to display  EKG  EKG Interpretation None       Radiology No results  found.  Procedures Procedures (including critical care time)  Medications Ordered in UC Medications - No data to display   Initial Impression / Assessment and Plan / UC Course  I have reviewed the triage vital signs and the nursing notes.  Pertinent labs & imaging results that were available during my care of the patient were reviewed by me and considered in my medical decision making (see chart for details).     42 year old female presents with suspected sinusitis.  There is no evidence that she has strep throat or an ear infection at this time.  We discussed watchful waiting versus empiric antibiotic.  Patient elected for the latter.  Treating with doxycycline.  Final Clinical Impressions(s) / UC Diagnoses   Final diagnoses:  Subacute sinusitis, unspecified location    ED Discharge Orders        Ordered    doxycycline (VIBRAMYCIN) 100 MG capsule  2 times daily     07/27/17 1224     Controlled Substance Prescriptions [place] Controlled Substance Registry consulted? Not Applicable   Tommie SamsCook, Herron Fero G, DO 07/27/17 1237

## 2017-07-27 NOTE — Discharge Instructions (Signed)
Antibiotic as prescribed.  Take care  Dr. Denaisha Swango
